# Patient Record
Sex: Male | Born: 1980 | Hispanic: Yes | Marital: Single | Smoking: Never smoker
Health system: Southern US, Community
[De-identification: ages and names within clinical notes are randomized; demographics above are authoritative.]

---

## 2017-08-26 ENCOUNTER — Encounter (HOSPITAL_COMMUNITY): Payer: Self-pay | Admitting: Emergency Medicine

## 2017-08-26 ENCOUNTER — Emergency Department (HOSPITAL_COMMUNITY): Payer: Self-pay

## 2017-08-26 ENCOUNTER — Emergency Department (HOSPITAL_COMMUNITY)
Admission: EM | Admit: 2017-08-26 | Discharge: 2017-08-26 | Disposition: A | Discharge status: Home / Self Care | Payer: Self-pay | Attending: Emergency Medicine | Admitting: Emergency Medicine

## 2017-08-26 DIAGNOSIS — R61 Generalized hyperhidrosis: Secondary | ICD-10-CM | POA: Insufficient documentation

## 2017-08-26 DIAGNOSIS — I300 Acute nonspecific idiopathic pericarditis: Secondary | ICD-10-CM | POA: Insufficient documentation

## 2017-08-26 DIAGNOSIS — R51 Headache: Secondary | ICD-10-CM | POA: Insufficient documentation

## 2017-08-26 DIAGNOSIS — R1012 Left upper quadrant pain: Secondary | ICD-10-CM | POA: Insufficient documentation

## 2017-08-26 DIAGNOSIS — H538 Other visual disturbances: Secondary | ICD-10-CM | POA: Insufficient documentation

## 2017-08-26 DIAGNOSIS — R0602 Shortness of breath: Secondary | ICD-10-CM | POA: Insufficient documentation

## 2017-08-26 DIAGNOSIS — R079 Chest pain, unspecified: Secondary | ICD-10-CM

## 2017-08-26 LAB — BASIC METABOLIC PANEL
Anion gap: 11 (ref 5–15)
BUN: 9 mg/dL (ref 6–20)
CALCIUM: 9.6 mg/dL (ref 8.9–10.3)
CHLORIDE: 100 mmol/L — AB (ref 101–111)
CO2: 23 mmol/L (ref 22–32)
CREATININE: 0.97 mg/dL (ref 0.61–1.24)
Glucose, Bld: 118 mg/dL — ABNORMAL HIGH (ref 65–99)
Potassium: 3.7 mmol/L (ref 3.5–5.1)
SODIUM: 134 mmol/L — AB (ref 135–145)

## 2017-08-26 LAB — URINALYSIS, ROUTINE W REFLEX MICROSCOPIC
BILIRUBIN URINE: NEGATIVE
GLUCOSE, UA: NEGATIVE mg/dL
HGB URINE DIPSTICK: NEGATIVE
Ketones, ur: NEGATIVE mg/dL
Leukocytes, UA: NEGATIVE
Nitrite: NEGATIVE
PROTEIN: NEGATIVE mg/dL
Specific Gravity, Urine: 1.004 — ABNORMAL LOW (ref 1.005–1.030)
pH: 5 (ref 5.0–8.0)

## 2017-08-26 LAB — LIPASE, BLOOD: LIPASE: 29 U/L (ref 11–51)

## 2017-08-26 LAB — I-STAT TROPONIN, ED: TROPONIN I, POC: 0 ng/mL (ref 0.00–0.08)

## 2017-08-26 LAB — CBC
HCT: 48.8 % (ref 39.0–52.0)
Hemoglobin: 17.3 g/dL — ABNORMAL HIGH (ref 13.0–17.0)
MCH: 31.1 pg (ref 26.0–34.0)
MCHC: 35.5 g/dL (ref 30.0–36.0)
MCV: 87.8 fL (ref 78.0–100.0)
PLATELETS: 330 10*3/uL (ref 150–400)
RBC: 5.56 MIL/uL (ref 4.22–5.81)
RDW: 12.7 % (ref 11.5–15.5)
WBC: 11 10*3/uL — AB (ref 4.0–10.5)

## 2017-08-26 MED ORDER — KETOROLAC TROMETHAMINE 15 MG/ML IJ SOLN
15.0000 mg | Freq: Once | INTRAMUSCULAR | Status: AC
  Administered 2017-08-26: 15 mg via INTRAVENOUS
  Filled 2017-08-26: qty 1

## 2017-08-26 MED ORDER — SODIUM CHLORIDE 0.9 % IV BOLUS (SEPSIS)
1000.0000 mL | Freq: Once | INTRAVENOUS | Status: AC
  Administered 2017-08-26: 1000 mL via INTRAVENOUS

## 2017-08-26 MED ORDER — COLCHICINE 0.6 MG PO TABS: 0.6000 mg | ORAL_TABLET | Freq: Every day | ORAL | 0 refills | Status: DC

## 2017-08-26 MED ORDER — GI COCKTAIL ~~LOC~~
30.0000 mL | Freq: Once | ORAL | Status: AC
  Administered 2017-08-26: 30 mL via ORAL
  Filled 2017-08-26: qty 30

## 2017-08-26 MED ORDER — PREDNISONE 10 MG PO TABS: ORAL_TABLET | ORAL | 0 refills | Status: DC

## 2017-08-26 NOTE — Discharge Instructions (Signed)
Please take 4 tablets of 10mg  each one time each day until your chest pain resolves or you reach two weeks. If your chest pain resolves or you have taken the tablets for two weeks, please begin to take 3 tablets daily for one week, followed by 2 tablets daily for one week followed by a final week of one tablet per day.   Please establish care with a primary care doctor. Two numbers with two separate facilities have been provided below for you to attempt to call and schedule with. It is important that you schedule an appointment with a primary care doctor for follow-up with your this visit as this condition could occur again or fail to improve with you current treatment. We would like for you to be seen in 3-4 days if your symptoms worsen or fail to improve even a little. If they improve significantly, it is still important for you to follow-up early next week.   If at any time your symptoms should worsen, become more severe, or should the pain should be associated with shortness of breath, dizziness, vomiting, headache or other concerning symptom, please return to the ED.  If you are unable to schedule an appointment with a primary care doctor in the next 3-5 days, please return to the ED for evaluation if your symptoms have not resolved.   Thank you for visiting the [facility] ED.

## 2017-08-26 NOTE — ED Notes (Addendum)
Unable to find pt in waiting room. Called multiple times in all areas and outside.

## 2017-08-26 NOTE — ED Triage Notes (Signed)
Pt presents with CP that radiates to L arm and back; pt states this evening began as upper abd pain and spread upward to his heart; pt describes pain as stabbing and also having nausea and lightheadedness; pt reports drinking 4-5 beers today as well

## 2017-08-26 NOTE — ED Provider Notes (Signed)
[facility]-EMERGENCY DEPT Provider Note   CSN: [id number] Arrival date & time: 08/26/17  0135     History   Chief Complaint Chief Complaint  Patient presents with  . Chest Pain    HPI Robert Bauer is a 36 y.o. male.  Patient presents with greater than 2 days of severe chest pain and upper left sided abdominal pain that began abruptly during his drive home from a birthday party. He denies past medical care or treatment for chronic medical conditions. Patient states that while on his return trip from the birthday party where he had consumed 5 16oz beers, he began to have severe acute onset left-sided, upper, abdominal pain which radiated to his chest and eventually to his shoulder and neck. States that the pain was associated with shortness of breath, burning sensation 10 out of 10 in severity. In addition, patient attested to diaphoresis, acute onset of headache and temporary vision loss/visual disturbance at the onset of pain within the first hour. He does have the visual change is complete blackout suspicion for a few seconds properly resolved. Patient denies family or personal history consistent with early MI, pulmonary embolism or blood clotting disorder. Patient denies previous associated symptoms or similar pain. In addition, patient states that he regularly consumes upwards of 10 beers per weekend but has not done so in 3 months prior to this most recent event. An interpreter was used for the evaluation of the patient.      History reviewed. No pertinent past medical history.  There are no active problems to display for this patient.   History reviewed. No pertinent surgical history.     Home Medications    Prior to Admission medications   Medication Sig Start Date End Date Taking? Authorizing Provider  colchicine 0.6 MG tablet Take 1 tablet (0.6 mg total) by mouth daily. 08/26/17   Lanelle Bal, MD  predniSONE (DELTASONE) 10 MG tablet Please take 4 tablets every day  until your chest pain resolves up to 2 weeks. At that time, take three tablets a day for 1 week, then 2 tablets a day for 1 week then 1 tablet a day for 1 week. Discard the remaining tablets 08/26/17   Lanelle Bal, MD    Family History History reviewed. No pertinent family history.  Social History Social History  Substance Use Topics  . Smoking status: Never Smoker  . Smokeless tobacco: Not on file  . Alcohol use Yes     Comment: 4-5 beers tonight     Allergies   Patient has no known allergies.   Review of Systems Review of Systems  Constitutional: Positive for appetite change and diaphoresis. Negative for chills and fever.  HENT: Negative for ear pain and sore throat.   Eyes: Positive for visual disturbance. Negative for pain.  Respiratory: Positive for chest tightness and shortness of breath. Negative for cough.   Cardiovascular: Positive for chest pain. Negative for palpitations.  Gastrointestinal: Positive for abdominal pain. Negative for blood in stool, constipation, diarrhea, nausea and vomiting.  Genitourinary: Negative for dysuria and hematuria.  Musculoskeletal: Negative for arthralgias and back pain.  Skin: Negative for color change and rash.  Neurological: Positive for headaches. Negative for seizures and syncope.  Psychiatric/Behavioral: The patient is nervous/anxious.   All other systems reviewed and are negative.    Physical Exam Updated Vital Signs BP (!) 135/92   Pulse 73   Temp 98.1 F (36.7 C)   Resp 19   SpO2 97%   Physical Exam  Constitutional: He is oriented to person, place, and time. He appears well-developed and well-nourished.  HENT:  Head: Normocephalic and atraumatic.  Eyes: Conjunctivae and EOM are normal.  Neck: Neck supple. No thyromegaly present.  Cardiovascular: Normal rate and regular rhythm.   No murmur heard. Pulmonary/Chest: Effort normal and breath sounds normal. No respiratory distress. He exhibits no tenderness.    Abdominal: Soft. Bowel sounds are normal. He exhibits no mass. There is tenderness (To deep palpation of the left upper quadrant, inferior to the ribs). There is no guarding.  Musculoskeletal: He exhibits no edema.  Lymphadenopathy:    He has no cervical adenopathy.  Neurological: He is alert and oriented to person, place, and time.  Skin: Skin is warm and dry. Capillary refill takes less than 2 seconds. No rash noted. No erythema.  Psychiatric: He has a normal mood and affect. His behavior is normal. Judgment and thought content normal.  Nursing note and vitals reviewed.    ED Treatments / Results  Labs (all labs ordered are listed, but only abnormal results are displayed) Labs Reviewed  BASIC METABOLIC PANEL - Abnormal; Notable for the following:       Result Value   Sodium 134 (*)    Chloride 100 (*)    Glucose, Bld 118 (*)    All other components within normal limits  CBC - Abnormal; Notable for the following:    WBC 11.0 (*)    Hemoglobin 17.3 (*)    All other components within normal limits  URINALYSIS, ROUTINE W REFLEX MICROSCOPIC - Abnormal; Notable for the following:    Color, Urine STRAW (*)    Specific Gravity, Urine 1.004 (*)    All other components within normal limits  LIPASE, BLOOD  I-STAT TROPONIN, ED    EKG  EKG Interpretation  Date/Time:  Tuesday August 26 2017 01:40:00 EDT Ventricular Rate:  114 PR Interval:  156 QRS Duration: 86 QT Interval:  318 QTC Calculation: 438 R Axis:   54 Text Interpretation:  Sinus tachycardia Minimal voltage criteria for LVH, may be normal variant ST-t wave abnormality Abnormal ekg Confirmed by Gerhard Munch [phone]) on 08/26/2017 8:50:38 AM       Radiology Dg Chest 2 View  Result Date: 08/26/2017 CLINICAL DATA:  Left-sided chest pain radiating to the arm and back, onset this evening. EXAM: CHEST  2 VIEW COMPARISON:  None. FINDINGS: The lungs are clear. The pulmonary vasculature is normal. Heart size is normal. Hilar  and mediastinal contours are unremarkable. There is no pleural effusion. IMPRESSION: No active cardiopulmonary disease. Electronically Signed   By: Ellery Plunk M.D.   On: 08/26/2017 02:36    Procedures Procedures (including critical care time)  Medications Ordered in ED Medications  ketorolac (TORADOL) 15 MG/ML injection 15 mg (15 mg Intravenous Given 08/26/17 1137)  sodium chloride 0.9 % bolus 1,000 mL (0 mLs Intravenous Stopped 08/26/17 1301)  gi cocktail (Maalox,Lidocaine,Donnatal) (30 mLs Oral Given 08/26/17 1301)     Initial Impression / Assessment and Plan / ED Course  I have reviewed the triage vital signs and the nursing notes.  Pertinent labs & imaging results that were available during my care of the patient were reviewed by me and considered in my medical decision making (see chart for details).    Given patient's presentation is most suffering from pericarditis versus pancreatitis versus gastritis. Previous i-STAT troponin was negative, as well as BMP and CBC indicating only a mild hyponatremia of 134, and leukocytosis of 11,000. Patient's chest  x-ray negative for acute cardiopulmonary disease pleural effusion. EKG indicated possible PR segment depression as well as possible diffuse ST elevation otherwise unremarkable, in general was not highly indicative of pericarditis. As patient has not eaten in 48 hours he'll be given a fluid bolus 1000 mL to improve his sodium. Further evaluation pending results of testing improvement with Toradol. Patient given Toradol an NSAID, for possible pericarditis with observation for improvement.  11:00 AM, UA with patient possible left-sided flank pain.  12:28 PM, Patient stated that his pain had minimally improved.   1:00 PM, patient stated that he was feeling significantly better after the fluids and Toradol. Interpreting service was utilized again to assist patient with understanding of his discharge plans. He was advised to follow-up with  his PCP for the ED in 3-5 days if symptoms do not resolve or worsen. In addition he was given a prescription for steroids and colchicine. The colchicine at 0.6 mg daily for 30 days, prednisone was to be taken 40 mg daily for 2 weeks or if his chest pain resolved, then 30 mg a day for a week, followed by 20 mg a day for a week followed by 10 mg a day for a week. Patient stated that he understood the medication regimen. He was advised to follow-up with PCP in 5-7 days even if symptoms resolved. Patient was discharged in good condition. An interpreter was obtained via the Ipad to facilitate communication between the patient and the MD for the process of completing the discharge.  She was seen and evaluated with Dr. Jeraldine Loots.  Final Clinical Impressions(s) / ED Diagnoses   Final diagnoses:  Chest pain, unspecified type  Acute idiopathic pericarditis    New Prescriptions Discharge Medication List as of 08/26/2017  1:00 PM    START taking these medications   Details  colchicine 0.6 MG tablet Take 1 tablet (0.6 mg total) by mouth daily., Starting Tue 08/26/2017, Print    predniSONE (DELTASONE) 10 MG tablet Please take 4 tablets every day until your chest pain resolves up to 2 weeks. At that time, take three tablets a day for 1 week, then 2 tablets a day for 1 week then 1 tablet a day for 1 week. Discard the remaining tablets, Print         Lanelle Bal, MD 08/26/17 2010    Gerhard Munch, MD 08/29/17 [phone]

## 2017-09-04 ENCOUNTER — Ambulatory Visit (HOSPITAL_COMMUNITY)
Admission: RE | Admit: 2017-09-04 | Discharge: 2017-09-04 | Disposition: A | Discharge status: Home / Self Care | Payer: Self-pay | Source: Ambulatory Visit | Attending: Cardiology | Admitting: Cardiology

## 2017-09-04 ENCOUNTER — Ambulatory Visit (INDEPENDENT_AMBULATORY_CARE_PROVIDER_SITE_OTHER): Payer: Self-pay | Admitting: Internal Medicine

## 2017-09-04 DIAGNOSIS — R9431 Abnormal electrocardiogram [ECG] [EKG]: Secondary | ICD-10-CM | POA: Insufficient documentation

## 2017-09-04 DIAGNOSIS — R079 Chest pain, unspecified: Secondary | ICD-10-CM

## 2017-09-04 MED ORDER — DICLOFENAC SODIUM 1 % TD GEL: 2.0000 g | Freq: Four times a day (QID) | TRANSDERMAL | 2 refills | Status: DC

## 2017-09-04 MED ORDER — PANTOPRAZOLE SODIUM 40 MG PO TBEC: 40.0000 mg | DELAYED_RELEASE_TABLET | Freq: Every day | ORAL | 2 refills | Status: AC

## 2017-09-04 MED ORDER — COLCHICINE 0.6 MG PO TABS: 0.6000 mg | ORAL_TABLET | Freq: Two times a day (BID) | ORAL | 2 refills | Status: AC

## 2017-09-04 MED ORDER — DICLOFENAC SODIUM 1 % TD GEL: 2.0000 g | Freq: Four times a day (QID) | TRANSDERMAL | 2 refills | Status: AC

## 2017-09-04 MED ORDER — PANTOPRAZOLE SODIUM 40 MG PO TBEC: 40.0000 mg | DELAYED_RELEASE_TABLET | Freq: Every day | ORAL | 2 refills | Status: DC

## 2017-09-04 MED ORDER — COLCHICINE 0.6 MG PO TABS: 0.6000 mg | ORAL_TABLET | Freq: Two times a day (BID) | ORAL | 0 refills | Status: DC

## 2017-09-04 MED FILL — COLCHICINE 0.6 MG TABLET: 0.6 | 30 days supply | Qty: 60 | Fill #0

## 2017-09-04 MED FILL — DICLOFENAC SODIUM 1% GEL: 1 | 13 days supply | Qty: 100 | Fill #0

## 2017-09-04 MED FILL — PANTOPRAZOLE SOD DR 40 MG T: 40 | 30 days supply | Qty: 30 | Fill #0

## 2017-09-04 NOTE — Progress Notes (Signed)
   CC: Patient is here for an ED follow-up of chest pain. He is here to establish care.  HPI:  [name] is a 36 y.o. male with no significant past medical history presenting to the clinic for an ED follow-up of chest pain. He is here to establish care. Please see problem based charting for the status of the patient's current and chronic medical conditions.   No past medical history on file.   Past medical history: None   Past surgical history: None  Medications: Prednisone and colchicine  Allergies: None  Social history: Drinks 4-5 beers every 8 days. No tobacco or illicit drug use.  Family history: None per patient  Review of Systems: Pertinent positives mentioned in HPI. Remainder of all ROS negative.   Physical Exam:  Vitals:   09/04/17 0951  BP: 130/83  Pulse: 63  Temp: 97.9 F (36.6 C)  TempSrc: Oral  SpO2: 100%  Weight: 204 lb 12.8 oz (92.9 kg)  Height: 5\' 7"  (1.702 m)   Physical Exam  Constitutional: He is oriented to person, place, and time. He appears well-developed and well-nourished. No distress.  HENT:  Head: Normocephalic and atraumatic.  Mouth/Throat: Oropharynx is clear and moist.  Eyes: Right eye exhibits no discharge. Left eye exhibits no discharge.  Neck: Neck supple. No tracheal deviation present.  Cardiovascular: Normal rate, regular rhythm and intact distal pulses.  Exam reveals no gallop and no friction rub.   No murmur heard. Pulmonary/Chest: Effort normal and breath sounds normal. No respiratory distress. He has no wheezes. He has no rales.  Abdominal: Soft. Bowel sounds are normal. He exhibits no distension. There is no tenderness.  Musculoskeletal: He exhibits no edema.  Ribs and chest wall tender to palpation on the left.  Neurological: He is alert and oriented to person, place, and time.  Skin: Skin is warm and dry.    Assessment & Plan:   See Encounters Tab for problem based charting.  Patient discussed with Dr. Criselda PeachesMullen

## 2017-09-04 NOTE — Assessment & Plan Note (Signed)
Patient is here for a recent ED follow-up of chest pain. His chest pain was thought to be secondary to possible pericarditis and he was started on a course of prednisone and colchicine. Patient reports having acute onset left upper quadrant abdominal pain after drinking 5 beers at his nephew's birthday party the day of his ED visit. States the pain then radiated to the left side of his chest and he experienced palpitations and shortness of breath. These symptoms led him to go to the ED for further evaluation. States he used to drink 4-5 beers every 8 days but has not consumed any alcohol since he left the ED. He believes his symptoms have improved but he still continues to experience constant left-sided rib pain. Also reports having intermittent left-sided "prickling" and "heavy" chest pain associated with heart palpitations lasting 6-8 seconds, shortness of breath, and occasional dizziness. States his pain is worse when lying down on the left side. Denies having any associated nausea. Also reports having heartburn for the past 2 months and continues to have it since he left the ED. Denies having any recent injury to his chest wall/ribs. Denies having any hematemesis, melena, or hematochezia.   EKG checked at this visit continues to show nonspecific ST segment changes. He has T-wave inversions in lead III and slight ST segment elevations in leads II, V5, and V6. As such, it is reasonable to continue treating him for possible pericarditis with colchicine. Other differentials include GERD and musculoskeletal pain as he was tender to palpation of his left chest wall and ribs. PE less likely as patient is not tachycardic (EKG showing heart rate 62) and he is not hypoxic.  Plan -Colchicine 0.6 mg twice daily for 3 months  -Discontinue prednisone  -Protonix 40 mg daily 30 minutes before breakfast  -Voltaren gel  -Follow-up in 4 weeks  -Instructions have been given to the patient using interpreter services (ID  [id number]).

## 2017-09-08 NOTE — Progress Notes (Signed)
Internal Medicine Clinic Attending  Case discussed with Dr. Rathoreat the time of the visit. We reviewed the resident's history and exam and pertinent patient test results. I agree with the assessment, diagnosis, and plan of care documented in the resident's note.  

## 2017-09-08 NOTE — Addendum Note (Signed)
Addended by: Debe CoderMULLEN, Tessy Pawelski B on: 09/08/2017 02:51 PM   Modules accepted: Level of Service

## 2017-09-29 ENCOUNTER — Other Ambulatory Visit: Payer: Self-pay | Admitting: Internal Medicine

## 2017-09-29 MED FILL — DICLOFENAC SODIUM 1% GEL: 1 | 13 days supply | Qty: 100 | Fill #1

## 2017-09-29 MED FILL — PANTOPRAZOLE SOD DR 40 MG T: 40 | 30 days supply | Qty: 30 | Fill #1

## 2019-02-10 IMAGING — DX DG CHEST 2V
2 series · 2 of 2 positions shown · non-contrast
Comparison: None.

CLINICAL DATA: Left-sided chest pain radiating to the arm and back,
onset this evening.

EXAM:
CHEST  2 VIEW

[chest pa]
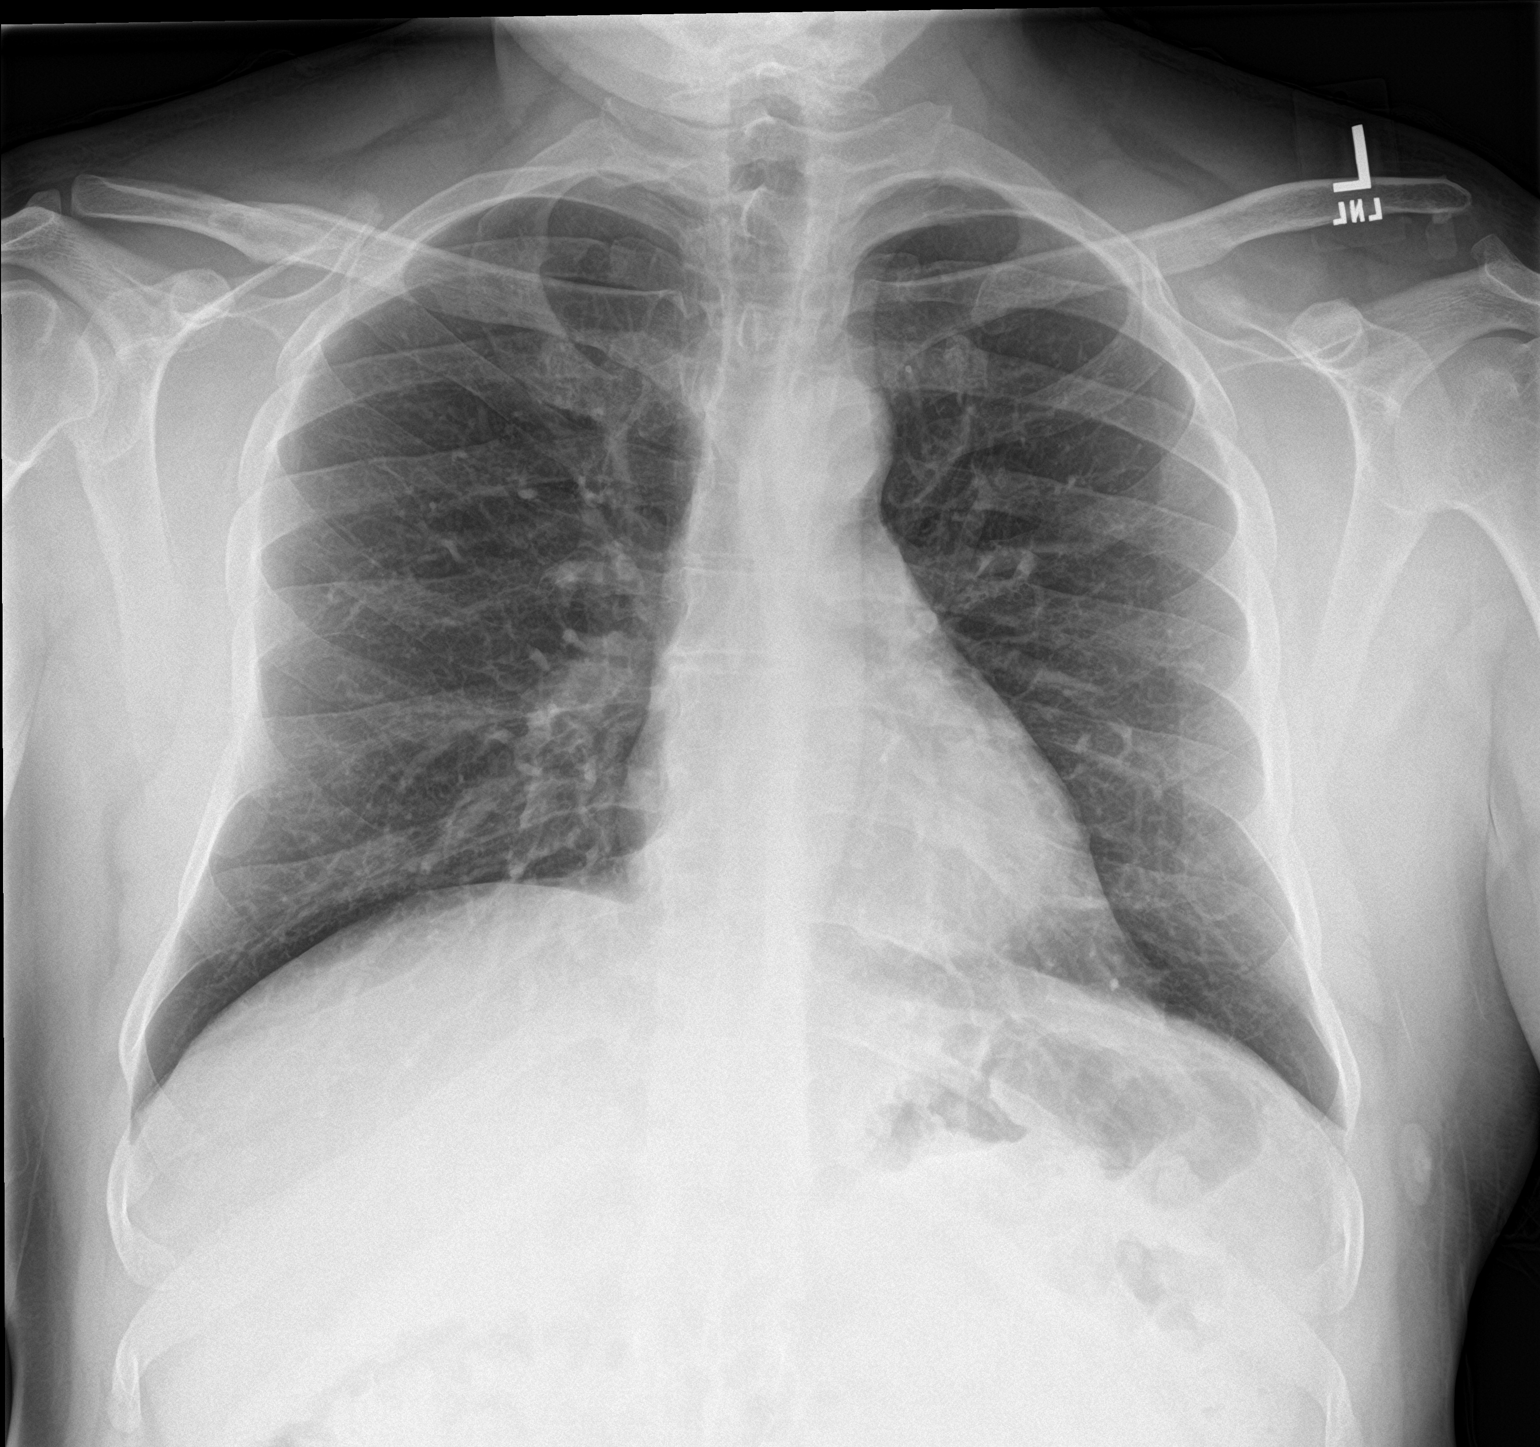

[chest lat]
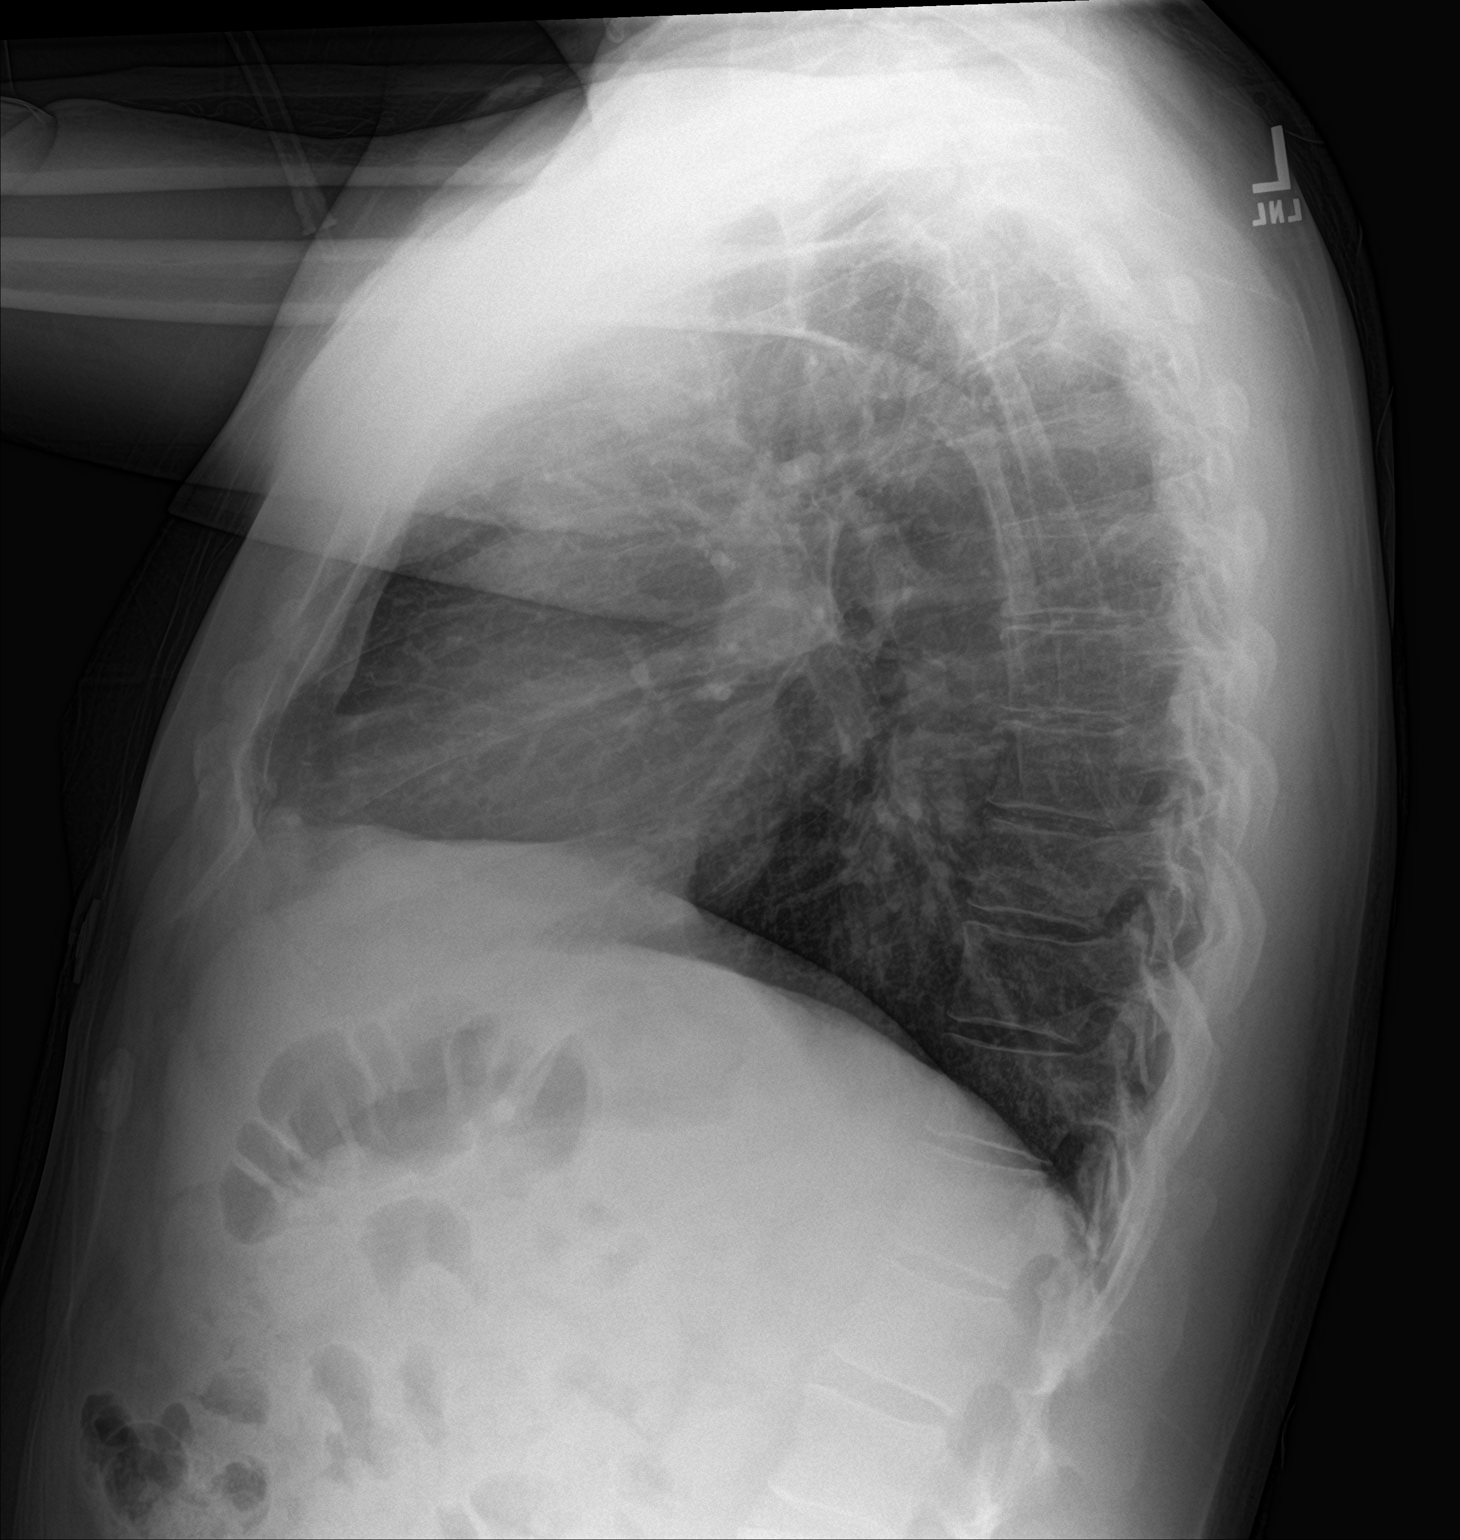

[2 of 2 positions shown; findings below may reference images not displayed]

FINDINGS: The lungs are clear. The pulmonary vasculature is normal. Heart size
is normal. Hilar and mediastinal contours are unremarkable. There is
no pleural effusion.
IMPRESSION: No active cardiopulmonary disease.
